# Patient Record
Sex: Female | Born: 2000 | Race: White | Hispanic: No | Marital: Single | Smoking: Never smoker
Health system: Southern US, Community
[De-identification: ages and names within clinical notes are randomized; demographics above are authoritative.]

## PROBLEM LIST (undated history)

## (undated) DIAGNOSIS — F419 Anxiety disorder, unspecified: Secondary | ICD-10-CM

## (undated) DIAGNOSIS — F32A Depression, unspecified: Secondary | ICD-10-CM

## (undated) DIAGNOSIS — F3181 Bipolar II disorder: Secondary | ICD-10-CM

## (undated) HISTORY — PX: MOUTH SURGERY: SHX715

## (undated) HISTORY — PX: FEMUR SURGERY: SHX943

---
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
## 2021-01-10 ENCOUNTER — Other Ambulatory Visit: Payer: Self-pay

## 2021-01-10 ENCOUNTER — Emergency Department
Admission: RE | Admit: 2021-01-10 | Discharge: 2021-01-10 | Disposition: A | Discharge status: Home / Self Care | Payer: BC Managed Care – PPO | Source: Ambulatory Visit

## 2021-01-10 VITALS — BP 120/79 | HR 92 | Temp 99.7°F | Resp 18 | Ht 62.0 in | Wt 115.0 lb

## 2021-01-10 DIAGNOSIS — R519 Headache, unspecified: Secondary | ICD-10-CM

## 2021-01-10 DIAGNOSIS — J029 Acute pharyngitis, unspecified: Secondary | ICD-10-CM | POA: Diagnosis not present

## 2021-01-10 HISTORY — DX: Depression, unspecified: F32.A

## 2021-01-10 HISTORY — DX: Bipolar II disorder: F31.81

## 2021-01-10 HISTORY — DX: Anxiety disorder, unspecified: F41.9

## 2021-01-10 LAB — POCT RAPID STREP A (OFFICE): Rapid Strep A Screen: NEGATIVE

## 2021-01-10 MED ORDER — PREDNISONE 20 MG PO TABS: ORAL_TABLET | ORAL | 0 refills | Status: DC

## 2021-01-10 MED ORDER — AZITHROMYCIN 250 MG PO TABS: 250.0000 mg | ORAL_TABLET | Freq: Every day | ORAL | 0 refills | Status: DC

## 2021-01-10 MED ORDER — KETOROLAC TROMETHAMINE 60 MG/2ML IM SOLN
60.0000 mg | Freq: Once | INTRAMUSCULAR | Status: AC
  Administered 2021-01-10: 60 mg via INTRAMUSCULAR

## 2021-01-10 NOTE — ED Triage Notes (Signed)
Pt presents to Urgent Care with c/o sore throat, headache, and chills since yesterday. Negative home COVID test done today. Pt has been vaccinated COVID, not flu.

## 2021-01-10 NOTE — Discharge Instructions (Signed)
Advised patient to take medications as directed with food to completion. Encouraged patient to increase daily water intake while taking these medications. 

## 2021-01-10 NOTE — ED Provider Notes (Signed)
[name]    CSN: [id number] Arrival date & time: 01/10/21  1614      History   Chief Complaint Chief Complaint  Patient presents with  . Sore Throat  . Headache  . Chills    HPI Jessica Cain is a 20 y.o. female.   HPI 20 year old female presents with sore throat, headache and chills for 1 day.  Reports negative home COVID-19 test.  Patient reports is vaccinated for COVID-19, not vaccinated for influenza this year.  Past Medical History:  Diagnosis Date  . Anxiety   . Bipolar 2 disorder (HCC)   . Depression     Patient Active Problem List   Diagnosis Date Noted  . Closed fracture of tuft of distal phalanx of right thumb 11/04/2017    Past Surgical History:  Procedure Laterality Date  . FEMUR SURGERY    . MOUTH SURGERY      OB History   No obstetric history on file.      Home Medications    Prior to Admission medications   Medication Sig Start Date End Date Taking? Authorizing Provider  azithromycin (ZITHROMAX) 250 MG tablet Take 1 tablet (250 mg total) by mouth daily. Take first 2 tablets together, then 1 every day until finished. 01/10/21  Yes Trevor Iha, FNP  buPROPion (ZYBAN) 150 MG 12 hr tablet Take 150 mg by mouth 2 (two) times daily.   Yes [provider]  Chlorphen-Phenyleph-Ibuprofen (ADVIL MULTI-SYMPTOM COLD & FLU PO) Take by mouth.   Yes [provider]  lamoTRIgine (LAMICTAL) 25 MG tablet Take 50 mg by mouth daily.   Yes [provider]  predniSONE (DELTASONE) 20 MG tablet Take 2 tabs PO daily x 5 days. 01/10/21  Yes Trevor Iha, FNP  sertraline (ZOLOFT) 50 MG tablet Take 50 mg by mouth daily.   Yes [provider]  Dupilumab, Asthma, (DUPIXENT [place]) Inject into the skin.    [provider]  Fexofenadine HCl (ALLEGRA PO) Take by mouth.    [provider]  hydrocortisone 2.5 % cream APPLY 1 APPLICATION TOPICALLY 2 (TWO) TIMES DAILY. 02/17/13   [provider]    Family  History Family History  Problem Relation Age of Onset  . Bipolar disorder Mother   . Healthy Father     Social History Social History   Tobacco Use  . Smoking status: Never Smoker  . Smokeless tobacco: Never Used  Vaping Use  . Vaping Use: Former  Substance Use Topics  . Alcohol use: Yes    Comment: occasionally  . Drug use: Not Currently     Allergies   Augmentin [amoxicillin-pot clavulanate], Cefzil [cefprozil], and Sulfa antibiotics   Review of Systems Review of Systems  Constitutional: Positive for fever.  HENT: Positive for sore throat.   Eyes: Negative.   Respiratory: Negative.   Cardiovascular: Negative.   Gastrointestinal: Negative.   Genitourinary: Negative.   Musculoskeletal: Negative.   Skin: Negative.   Neurological: Negative.      Physical Exam Triage Vital Signs ED Triage Vitals  Enc Vitals Group     BP 01/10/21 1700 120/79     Pulse Rate 01/10/21 1700 92     Resp 01/10/21 1700 18     Temp 01/10/21 1700 99.7 F (37.6 C)     Temp Source 01/10/21 1700 Oral     SpO2 01/10/21 1700 99 %     Weight 01/10/21 1653 115 lb (52.2 kg)     Height 01/10/21 1653 5'  2" (1.575 m)     Head Circumference --      Peak Flow --      Pain Score 01/10/21 1653 7     Pain Loc --      Pain Edu? --      Excl. in GC? --    No data found.  Updated Vital Signs BP 120/79 (BP Location: Right Arm)   Pulse 92   Temp 99.7 F (37.6 C) (Oral)   Resp 18   Ht 5\' 2"  (1.575 m)   Wt 115 lb (52.2 kg)   LMP 12/29/2020 (Exact Date)   SpO2 99%   BMI 21.03 kg/m      Physical Exam Vitals and nursing note reviewed.  Constitutional:      General: She is not in acute distress.    Appearance: She is well-developed and normal weight. She is not ill-appearing.  HENT:     Head: Normocephalic and atraumatic.     Right Ear: Tympanic membrane and ear canal normal.     Left Ear: Tympanic membrane and ear canal normal.     Mouth/Throat:     Mouth: Mucous membranes are moist.      Pharynx: Pharyngeal swelling, oropharyngeal exudate, posterior oropharyngeal erythema and uvula swelling present.     Tonsils: Tonsillar exudate present. 3+ on the right. 3+ on the left.  Eyes:     Conjunctiva/sclera: Conjunctivae normal.     Pupils: Pupils are equal, round, and reactive to light.  Cardiovascular:     Rate and Rhythm: Normal rate and regular rhythm.     Heart sounds: Normal heart sounds.  Pulmonary:     Effort: Pulmonary effort is normal.     Breath sounds: Rhonchi present. No wheezing or rales.  Musculoskeletal:     Cervical back: Normal range of motion and neck supple.  Lymphadenopathy:     Cervical: Cervical adenopathy present.  Skin:    General: Skin is warm and dry.  Neurological:     General: No focal deficit present.     Mental Status: She is alert and oriented to person, place, and time.  Psychiatric:        Mood and Affect: Mood normal.        Behavior: Behavior normal.      UC Treatments / Results  Labs (all labs ordered are listed, but only abnormal results are displayed) Labs Reviewed  POCT RAPID STREP A (OFFICE)    EKG   Radiology No results found.  Procedures Procedures (including critical care time)  Medications Ordered in UC Medications  ketorolac (TORADOL) injection 60 mg (60 mg Intramuscular Given 01/10/21 1753)    Initial Impression / Assessment and Plan / UC Course  I have reviewed the triage vital signs and the nursing notes.  Pertinent labs & imaging results that were available during my care of the patient were reviewed by me and considered in my medical decision making (see chart for details).     MDM: 1.  Acute pharyngitis, 2.  Intractable episodic headache, unspecified headache type.  Patient discharged home, hemodynamically stable. Final Clinical Impressions(s) / UC Diagnoses   Final diagnoses:  Pharyngitis, unspecified etiology  Intractable episodic headache, unspecified headache type     Discharge  Instructions     Advised patient to take medications as directed with food to completion.  Encouraged patient to increase daily water intake while taking these medications.    ED Prescriptions    Medication Sig Dispense Auth. Provider  azithromycin (ZITHROMAX) 250 MG tablet Take 1 tablet (250 mg total) by mouth daily. Take first 2 tablets together, then 1 every day until finished. 6 tablet Trevor Iha, FNP   predniSONE (DELTASONE) 20 MG tablet Take 2 tabs PO daily x 5 days. 10 tablet Trevor Iha, FNP     PDMP not reviewed this encounter.   Trevor Iha, FNP 01/10/21 Mallie Snooks

## 2022-05-18 ENCOUNTER — Ambulatory Visit: Payer: Self-pay

## 2022-05-18 NOTE — Telephone Encounter (Signed)
  Chief Complaint: Swollen lymph node Symptoms: ibid Frequency: years Pertinent Negatives: Patient denies fever, difficulty breathing, or swallowing. Disposition: [] ED /[] Urgent Care (no appt availability in office) / [] Appointment(In office/virtual)/ []  [facility]/ [] Home Care/ [] Refused Recommended Disposition /[] [facility]/ [x]  Follow-up with PCP Additional Notes: Pt has ENT appt Monday. PT will go to ED if needed.    Reason for Disposition  [1] Large node AND [2] present > 2 weeks  Answer Assessment - Initial Assessment Questions 1. LOCATION: "Where is the swollen node located?" "Is the matching node on the other side of the body also swollen?"      Neck - throat 2. SIZE: "How big is the node?" (e.g., inches or centimeters; or compared to common objects such as pea, bean, marble, golf ball)      Golf balls 3. ONSET: "When did the swelling start?"      years 4. NECK NODES: "Is there a sore throat, runny nose or other symptoms of a cold?"      This morning - always swollen a couple of years 5. GROIN OR ARMPIT NODES: "Is there a sore, scratch, cut or painful red area on that arm or leg?"      Throat 6. FEVER: "Do you have a fever?" If Yes, ask: "What is it, how was it measured, and when did it start?"      Night sweats 7. CAUSE: "What do you think is causing the swollen lymph nodes?"     Unsure -  8. OTHER SYMPTOMS: "Do you have any other symptoms?"     no 9. PREGNANCY: "Is there any chance you are pregnant?" "When was your last menstrual period?"     No  Protocols used: Lymph Nodes - Swollen-A-AH

## 2023-05-20 NOTE — Progress Notes (Unsigned)
New Patient Note  RE: Jessica Cain MRN: [id number] DOB: December 04, 2000 Date of Office Visit: 05/21/2023  Consult requested by: Arliss Journey, PA-C Primary care provider: Arliss Journey, PA-C  Chief Complaint: Allergic Rhinitis   History of Present Illness: I had the pleasure of seeing Jessica Cain for initial evaluation at the [facility] on 05/21/2023. She is a 22 y.o. female, who is referred here by Arliss Journey, PA-C for the evaluation of allergic rhinitis.  Discussed the use of AI scribe software for clinical note transcription with the patient, who gave verbal consent to proceed.  The patient, with a history of eczema, asthma, and allergies, presents with concerns of recurrent  neck lymphadenopathy over the past few months. She reports that her lymph nodes periodically swell and become tender to touch, but then resolve. These episodes are not associated with any systemic symptoms such as fever or malaise, nor with any upper respiratory symptoms such as stuffy nose, runny nose, or sneezing.  The patient also reports a history of environmental allergies, manifesting as stuffy nose and scratchy throat, which she manages with daily Allegra. She notes that her symptoms tend to worsen in the fall. She has not tried any nasal sprays or other allergy medications. She was on allergy shots about ten years ago for 1 year, which she believes helped her symptoms.  In addition to her lymphadenopathy and allergies, the patient reports a history of eczema since infancy, affecting her entire body. She manages her eczema with triamcinolone and frequent moisturizing. She has seen a dermatologist for this issue, and has tried Dupixent in the past which was ineffective.   The patient also has a history of asthma, for which she has an albuterol inhaler. However, she reports not needing to use it for several years. Despite this, she reports experiencing shortness of breath on a daily  basis for the past year, even at rest. She has not required prednisone or hospitalization for her asthma.  The patient has multiple allergies, including to all nuts, pet dander, grass, dust, penicillin, Augmentin, cefazolin, and sulfa. Her reactions typically manifest as rashes, and in the case of nut allergies, throat swelling. She carries an EpiPen but has not needed to use it. She also reports a reaction to bee stings, which causes a localized rash.  The patient has a history of a blood clotting disorder and has seen an infectious disease specialist for her lymphadenopathy. She has also had a biopsy of one of her lymph nodes, which was reportedly normal. She has not had any recent fevers, chills, or changes in appetite or bowel habits. She reports occasional heartburn, but does not consider it severe. She does not have any pets at home.   Last eye exam: 1-2 years ago. History of reflux: sometimes.  Assessment and Plan: Jessica Cain is a 22 y.o. female with: Other allergic rhinitis Year-round symptoms managed with daily Allegra, worse in the fall. History of allergy shots in middle school with some improvement. Concerned if cervical lymphadenopathy due to this.  Return for environmental allergy skin testing. Will make additional recommendations based on results.  Moderate persistent asthma without complication Daily shortness of breath for the past year. No recent use of albuterol inhaler. Regular exercise with noted difficulty catching breath post-cardio. Today's spirometry showed: normal pattern with 5% improvement in FEV1 post bronchodilator treatment. Clinically feeling improved.  Daily controller medication(s): start Breo 1 puff once a day and rinse mouth after each use. Demonstrated proper use.  Prior to physical activity: May use albuterol rescue inhaler 2 puffs 5 to 15 minutes prior to strenuous physical activities. Rescue medications: May use albuterol rescue inhaler 2 puffs every 4  to 6 hours as needed for shortness of breath, chest tightness, coughing, and wheezing. Monitor frequency of use.   Lymphadenopathy, cervical Intermittent swelling of lymph nodes over the past few years, without associated illness or symptoms. Previous biopsy showed no abnormalities. Monitor symptoms.  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 Return for food allergy skin testing. Continue to avoid peanuts and tree nuts. For mild symptoms you can take over the counter antihistamines such as Benadryl 1-2 tablets = 25-50mg  and monitor symptoms closely. If symptoms worsen or if you have severe symptoms including breathing issues, throat closure, significant swelling, whole body hives, severe diarrhea and vomiting, lightheadedness then inject epinephrine and seek immediate medical care afterwards. Emergency action plan given.   Other atopic dermatitis Longstanding condition since childhood, currently managed with topical steroid creams. Recent trial of Dupixent without noticeable improvement.  See below for proper skin care. Don't wear any perfume. Use fragrance free and dye free products. No dryer sheets or fabric softener.    Return in about 2 weeks (around 06/04/2023) for Skin testing.  Meds ordered this encounter  Medications   fluticasone furoate-vilanterol (BREO ELLIPTA) 100-25 MCG/ACT AEPB    Sig: Inhale 1 puff into the lungs daily. Rinse mouth after each use.    Dispense:  60 each    Refill:  2   Lab Orders  No laboratory test(s) ordered today    Other allergy screening: Asthma: yes Food allergy: yes Dietary History: patient has been eating other foods including milk, eggs, sesame, shellfish, fish, soy, wheat, meats, fruits and vegetables.  She reports reading labels and avoiding peanuts, tree nuts in diet completely.  Medication allergy: yes Hymenoptera allergy: no Urticaria:  no Eczema:yes History of recurrent infections suggestive of immunodeficency: no  Diagnostics: Spirometry:  Tracings reviewed. Her effort: Good reproducible efforts. FVC: 3.78L FEV1: 3.14L, 102% predicted FEV1/FVC ratio: 83% Interpretation: Spirometry consistent with normal pattern with 5% improvement in FEV1 post bronchodilator treatment. Clinically feeling improved.   Please see scanned spirometry results for details.  Past Medical History: Patient Active Problem List   Diagnosis Date Noted   Closed fracture of tuft of distal phalanx of right thumb 11/04/2017   Past Medical History:  Diagnosis Date   Anxiety    Bipolar 2 disorder (HCC)    Depression    Past Surgical History: Past Surgical History:  Procedure Laterality Date   FEMUR SURGERY     MOUTH SURGERY     Medication List:  Current Outpatient Medications  Medication Sig Dispense Refill   Ascorbic Acid (VITAMIN C CR) 500 MG CPCR Take by mouth.     buPROPion (WELLBUTRIN XL) 150 MG 24 hr tablet Take 150 mg by mouth daily.     Fexofenadine HCl (ALLEGRA PO) Take by mouth.     fluticasone furoate-vilanterol (BREO ELLIPTA) 100-25 MCG/ACT AEPB Inhale 1 puff into the lungs daily. Rinse mouth after each use. 60 each 2   hydrocortisone 2.5 % cream APPLY 1 APPLICATION TOPICALLY 2 (TWO) TIMES DAILY.     hydrOXYzine (ATARAX) 25 MG tablet Take 25 mg by mouth every 8 (eight) hours as needed.     triamcinolone cream (KENALOG) 0.1 % Apply 1 Application topically 2 (two) times daily.     No current facility-administered  medications for this visit.   Allergies: Allergies  Allergen Reactions   Augmentin [Amoxicillin-Pot Clavulanate]    Cefzil [Cefprozil]    Sulfa Antibiotics    Social History: Social History   Socioeconomic History   Marital status: Single    Spouse name: Not on file   Number of children: Not on file   Years of education: Not on file   Highest education level: Not on file  Occupational History   Not on  file  Tobacco Use   Smoking status: Never    Passive exposure: Never   Smokeless tobacco: Never  Vaping Use   Vaping status: Some Days  Substance and Sexual Activity   Alcohol use: Yes    Comment: occasionally   Drug use: Not Currently   Sexual activity: Not on file  Other Topics Concern   Not on file  Social History Narrative   Not on file   Social Determinants of Health   Financial Resource Strain: Low Risk  (09/11/2022)   Received from [facility]   Overall Financial Resource Strain (CARDIA)    Difficulty of Paying Living Expenses: Not hard at all  Food Insecurity: No Food Insecurity (09/11/2022)   Received from [facility]   Hunger Vital Sign    Worried About Running Out of Food in the Last Year: Never true    Ran Out of Food in the Last Year: Never true  Transportation Needs: No Transportation Needs (09/11/2022)   Received from [facility] - Transportation    Lack of Transportation (Medical): No    Lack of Transportation (Non-Medical): No  Physical Activity: Sufficiently Active (05/09/2022)   Received from [facility]   Exercise Vital Sign    Days of Exercise per Week: 6 days    Minutes of Exercise per Session: 90 min  Stress: No Stress Concern Present (05/09/2022)   Received from [facility] - Occupational Stress Questionnaire    Feeling of Stress : Only a little  Social Connections: Unknown (05/16/2023)   Received from [facility]   Social Network    Social Network: Not on file   Lives in a house. Smoking: vapes sometimes Occupation: Architectural technologist HistorySurveyor, minerals in the house: no Engineer, civil (consulting) in the family room: no Carpet in the bedroom: yes Heating: electric Cooling: central Pet: no  Family History: Family History  Problem Relation Age of Onset   Bipolar disorder Mother    Healthy Father    Allergic rhinitis Neg Hx    Angioedema Neg Hx     Asthma Neg Hx    Atopy Neg Hx    Eczema Neg Hx    Immunodeficiency Neg Hx    Urticaria Neg Hx    Review of Systems  Constitutional:  Negative for appetite change, chills, fever and unexpected weight change.  HENT:  Negative for congestion and rhinorrhea.   Eyes:  Negative for itching.  Respiratory:  Positive for shortness of breath. Negative for cough, chest tightness and wheezing.   Cardiovascular:  Negative for chest pain.  Gastrointestinal:  Negative for abdominal pain.  Genitourinary:  Negative for difficulty urinating.  Skin:  Positive for rash.  Neurological:  Negative for headaches.    Objective: BP 100/70   Pulse 74   Temp 98.6 F (37 C) (Temporal)   Resp 18   Ht 5' 2.5" (1.588 m)   Wt 110 lb 12.8 oz (50.3 kg)  SpO2 98%   BMI 19.94 kg/m  Body mass index is 19.94 kg/m. Physical Exam Vitals and nursing note reviewed.  Constitutional:      Appearance: Normal appearance. She is well-developed.  HENT:     Head: Normocephalic and atraumatic.     Right Ear: Tympanic membrane and external ear normal.     Left Ear: Tympanic membrane and external ear normal.     Nose: Nose normal.     Mouth/Throat:     Mouth: Mucous membranes are moist.     Pharynx: Oropharynx is clear.  Eyes:     Conjunctiva/sclera: Conjunctivae normal.  Cardiovascular:     Rate and Rhythm: Normal rate and regular rhythm.     Heart sounds: Normal heart sounds. No murmur heard.    No friction rub. No gallop.  Pulmonary:     Effort: Pulmonary effort is normal.     Breath sounds: Normal breath sounds. No wheezing, rhonchi or rales.  Musculoskeletal:     Cervical back: Neck supple.  Skin:    General: Skin is warm.     Findings: No rash.     Comments: Cracked, dry skin on the corners of the lips.   Neurological:     Mental Status: She is alert and oriented to person, place, and time.  Psychiatric:        Behavior: Behavior normal.   The plan was reviewed with the patient/family, and all  questions/concerned were addressed.  It was my pleasure to see [facility] today and participate in her care. Please feel free to contact me with any questions or concerns.  Sincerely,  Wyline Mood, DO Allergy & Immunology  [facility] office: [phone] [facility] office: [phone]

## 2023-05-21 ENCOUNTER — Ambulatory Visit: Payer: BC Managed Care – PPO | Admitting: Allergy

## 2023-05-21 ENCOUNTER — Encounter: Payer: Self-pay | Admitting: Allergy

## 2023-05-21 VITALS — BP 100/70 | HR 74 | Temp 98.6°F | Resp 18 | Ht 62.5 in | Wt 110.8 lb

## 2023-05-21 DIAGNOSIS — J4540 Moderate persistent asthma, uncomplicated: Secondary | ICD-10-CM

## 2023-05-21 DIAGNOSIS — J3089 Other allergic rhinitis: Secondary | ICD-10-CM | POA: Diagnosis not present

## 2023-05-21 DIAGNOSIS — R590 Localized enlarged lymph nodes: Secondary | ICD-10-CM

## 2023-05-21 DIAGNOSIS — L2089 Other atopic dermatitis: Secondary | ICD-10-CM

## 2023-05-21 DIAGNOSIS — T7800XD Anaphylactic reaction due to unspecified food, subsequent encounter: Secondary | ICD-10-CM | POA: Diagnosis not present

## 2023-05-21 MED ORDER — FLUTICASONE FUROATE-VILANTEROL 100-25 MCG/ACT IN AEPB: 1.0000 | INHALATION_SPRAY | Freq: Every day | RESPIRATORY_TRACT | 2 refills | Status: DC

## 2023-05-21 NOTE — Patient Instructions (Signed)
Rhinitis  Return for environmental allergy skin testing. Will make additional recommendations based on results. Make sure you don't take any antihistamines for 3 days before the skin testing appointment. Don't put any lotion on the back. Plan on being here for 30-60 minutes.   Lymph nodes Monitor symptoms.  Breathing Daily controller medication(s): start Breo 1 puff once a day and rinse mouth after each use.  Check the pricing for the following inhalers if Virgel Bouquet is not covered. Dulera Advair HFA Advair Diskus Wixela Symbicort  Prior to physical activity: May use albuterol rescue inhaler 2 puffs 5 to 15 minutes prior to strenuous physical activities. Rescue medications: May use albuterol rescue inhaler 2 puffs every 4 to 6 hours as needed for shortness of breath, chest tightness, coughing, and wheezing. Monitor frequency of use.  Breathing control goals:  Full participation in all desired activities (may need albuterol before activity) Albuterol use two times or less a week on average (not counting use with activity) Cough interfering with sleep two times or less a month Oral steroids no more than once a year No hospitalizations   Food allergies Return for food allergy skin testing. Continue to avoid peanuts and tree nuts. For mild symptoms you can take over the counter antihistamines such as Benadryl 1-2 tablets = 25-50mg  and monitor symptoms closely. If symptoms worsen or if you have severe symptoms including breathing issues, throat closure, significant swelling, whole body hives, severe diarrhea and vomiting, lightheadedness then inject epinephrine and seek immediate medical care afterwards. Emergency action plan given.   Eczema  See below for proper skin care. Don't wear any perfume. Use fragrance free and dye free products. No dryer sheets or fabric softener.    Return for Skin testing. On 10/29 at [facility]  Skin care recommendations  Bath time: Always use  lukewarm water. AVOID very hot or cold water. Keep bathing time to 5-10 minutes. Do NOT use bubble bath. Use a mild soap and use just enough to wash the dirty areas. Do NOT scrub skin vigorously.  After bathing, pat dry your skin with a towel. Do NOT rub or scrub the skin.  Moisturizers and prescriptions:  ALWAYS apply moisturizers immediately after bathing (within 3 minutes). This helps to lock-in moisture. Use the moisturizer several times a day over the whole body. Good summer moisturizers include: Aveeno, CeraVe, Cetaphil. Good winter moisturizers include: Aquaphor, Vaseline, Cerave, Cetap0hil, Eucerin, Vanicream. When using moisturizers along with medications, the moisturizer should be applied about one hour after applying the medication to prevent diluting effect of the medication or moisturize around where you applied the medications. When not using medications, the moisturizer can be continued twice daily as maintenance.  Laundry and clothing: Avoid laundry products with added color or perfumes. Use unscented hypo-allergenic laundry products such as Tide free, Cheer free & gentle, and All free and clear.  If the skin still seems dry or sensitive, you can try double-rinsing the clothes. Avoid tight or scratchy clothing such as wool. Do not use fabric softeners or dyer sheets.

## 2023-06-03 NOTE — Progress Notes (Deleted)
Skin testing note  RE: Jessica Cain MRN: [id number] DOB: 2001/01/03 Date of Office Visit: 06/04/2023  Referring provider: Arliss Journey, PA-C Primary care provider: Arliss Journey, PA-C  Chief Complaint: No chief complaint on file.  History of Present Illness: I had the pleasure of seeing Jessica Cain for a skin testing visit at the [facility] on 06/03/2023. She is a 22 y.o. female, who is being followed for allergic rhinitis, asthma, food allergy, atopic dermatitis, cervical lymphadenopathy. Her previous allergy office visit was on 05/21/2023 with Dr. Selena Batten. Today is a skin testing visit.   Discussed the use of AI scribe software for clinical note transcription with the patient, who gave verbal consent to proceed.  History of Present Illness             Other allergic rhinitis Year-round symptoms managed with daily Allegra, worse in the fall. History of allergy shots in middle school with some improvement. Concerned if cervical lymphadenopathy due to this.  Return for environmental allergy skin testing. Will make additional recommendations based on results.   Moderate persistent asthma without complication Daily shortness of breath for the past year. No recent use of albuterol inhaler. Regular exercise with noted difficulty catching breath post-cardio. Today's spirometry showed: normal pattern with 5% improvement in FEV1 post bronchodilator treatment. Clinically feeling improved.  Daily controller medication(s): start Breo 1 puff once a day and rinse mouth after each use. Demonstrated proper use.  Prior to physical activity: May use albuterol rescue inhaler 2 puffs 5 to 15 minutes prior to strenuous physical activities. Rescue medications: May use albuterol rescue inhaler 2 puffs every 4 to 6 hours as needed for shortness of breath, chest tightness, coughing, and wheezing. Monitor frequency of use.    Lymphadenopathy, cervical Intermittent swelling  of lymph nodes over the past few years, without associated illness or symptoms. Previous biopsy showed no abnormalities. Monitor symptoms.   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 Return for food allergy skin testing. Continue to avoid peanuts and tree nuts. For mild symptoms you can take over the counter antihistamines such as Benadryl 1-2 tablets = 25-50mg  and monitor symptoms closely. If symptoms worsen or if you have severe symptoms including breathing issues, throat closure, significant swelling, whole body hives, severe diarrhea and vomiting, lightheadedness then inject epinephrine and seek immediate medical care afterwards. Emergency action plan given.    Other atopic dermatitis Longstanding condition since childhood, currently managed with topical steroid creams. Recent trial of Dupixent without noticeable improvement.  See below for proper skin care. Don't wear any perfume. Use fragrance free and dye free products. No dryer sheets or fabric softener.     Return in about 2 weeks (around 06/04/2023) for Skin testing. Assessment and Plan: [name] is a 22 y.o. female with: ***  Assessment and Plan              No follow-ups on file.  No orders of the defined types were placed in this encounter.  Lab Orders  No laboratory test(s) ordered today    Diagnostics: Spirometry:  Tracings reviewed. Her effort: {Blank single:19197::"Good reproducible efforts.","It was hard to get consistent efforts and there is a question as to whether this reflects a maximal maneuver.","Poor effort, data can not be interpreted."} FVC: ***L FEV1: ***L, ***% predicted FEV1/FVC ratio: ***% Interpretation: {Blank single:19197::"Spirometry consistent with mild obstructive disease","Spirometry consistent with moderate obstructive disease","Spirometry consistent with severe obstructive disease","Spirometry consistent  with possible restrictive disease","Spirometry consistent with mixed obstructive and restrictive disease","Spirometry uninterpretable due to technique","Spirometry consistent with normal pattern","No overt abnormalities noted given today's efforts"}.  Please see scanned spirometry results for details.  Skin Testing: Environmental allergy panel and select foods. *** Results discussed with patient/family.   Previous notes and tests were reviewed. The plan was reviewed with the patient/family, and all questions/concerned were addressed.  It was my pleasure to see [facility] today and participate in her care. Please feel free to contact me with any questions or concerns.  Sincerely,  Wyline Mood, DO Allergy & Immunology  [facility] office: [phone] [facility] office: [phone]

## 2023-06-04 ENCOUNTER — Ambulatory Visit: Payer: BC Managed Care – PPO | Admitting: Allergy

## 2023-06-13 ENCOUNTER — Ambulatory Visit: Payer: BC Managed Care – PPO | Admitting: Allergy

## 2023-06-13 NOTE — Progress Notes (Deleted)
Skin testing note  RE: Jessica [facility] MRN: [id number] DOB: [birth year] Date of Office Visit: 06/13/2023  Referring provider: Arliss Journey, PA-C Primary care provider: Arliss Journey, PA-C  Chief Complaint: No chief complaint on file.  History of Present Illness: I had the pleasure of seeing Jessica [facility] for a skin testing visit at the [facility] on 06/13/2023. She is a 22 y.o. female, who is being followed for allergic rhinitis, asthma, food allergy, atopic dermatitis. Her previous allergy office visit was on 05/21/2023 with Dr. Selena [facility]. Today is a skin testing visit.   Discussed the use of AI scribe software for clinical note transcription with the patient, who gave verbal consent to proceed.  History of Present Illness             Other allergic rhinitis Year-round symptoms managed with daily Allegra, worse in the fall. History of allergy shots in middle school with some improvement. Concerned if cervical lymphadenopathy due to this.  Return for environmental allergy skin testing. Will make additional recommendations based on results.   Moderate persistent asthma without complication Daily shortness of breath for the past year. No recent use of albuterol inhaler. Regular exercise with noted difficulty catching breath post-cardio. Today's spirometry showed: normal pattern with 5% improvement in FEV1 post bronchodilator treatment. Clinically feeling improved.  Daily controller medication(s): start Breo 1 puff once a day and rinse mouth after each use. Demonstrated proper use.  Prior to physical activity: May use albuterol rescue inhaler 2 puffs 5 to 15 minutes prior to strenuous physical activities. Rescue medications: May use albuterol rescue inhaler 2 puffs every 4 to 6 hours as needed for shortness of breath, chest tightness, coughing, and wheezing. Monitor frequency of use.    Lymphadenopathy, cervical Intermittent swelling of lymph nodes over the past  few years, without associated illness or symptoms. Previous biopsy showed no abnormalities. Monitor symptoms.   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 Return for food allergy skin testing. Continue to avoid peanuts and tree nuts. For mild symptoms you can take over the counter antihistamines such as Benadryl 1-2 tablets = 25-50mg  and monitor symptoms closely. If symptoms worsen or if you have severe symptoms including breathing issues, throat closure, significant swelling, whole body hives, severe diarrhea and vomiting, lightheadedness then inject epinephrine and seek immediate medical care afterwards. Emergency action plan given.    Other atopic dermatitis Longstanding condition since childhood, currently managed with topical steroid creams. Recent trial of Dupixent without noticeable improvement.  See below for proper skin care. Don't wear any perfume. Use fragrance free and dye free products. No dryer sheets or fabric softener.     Return in about 2 weeks (around 06/04/2023) for Skin testing. Assessment and Plan: [name] is a 22 y.o. female with: ***  Assessment and Plan              No follow-ups on file.  No orders of the defined types were placed in this encounter.  Lab Orders  No laboratory test(s) ordered today    Diagnostics: Spirometry:  Tracings reviewed. Her effort: {Blank single:19197::"Good reproducible efforts.","It was hard to get consistent efforts and there is a question as to whether this reflects a maximal maneuver.","Poor effort, data can not be interpreted."} FVC: ***L FEV1: ***L, ***% predicted FEV1/FVC ratio: ***% Interpretation: {Blank single:19197::"Spirometry consistent with mild obstructive disease","Spirometry consistent with moderate obstructive disease","Spirometry consistent with severe obstructive disease","Spirometry consistent with possible  restrictive  disease","Spirometry consistent with mixed obstructive and restrictive disease","Spirometry uninterpretable due to technique","Spirometry consistent with normal pattern","No overt abnormalities noted given today's efforts"}.  Please see scanned spirometry results for details.  Skin Testing: {Blank single:19197::"Select foods","Environmental allergy panel","Environmental allergy panel and select foods","Food allergy panel","None","Deferred due to recent antihistamines use"}. *** Results discussed with patient/family.   Previous notes and tests were reviewed. The plan was reviewed with the patient/family, and all questions/concerned were addressed.  It was my pleasure to see [name] today and participate in her care. Please feel free to contact me with any questions or concerns.  Sincerely,  Wyline Mood, DO Allergy & Immunology  [facility] office: [phone] [facility] office: [phone]

## 2023-06-26 NOTE — Progress Notes (Unsigned)
Skin testing note  RE: Jessica Cain MRN: [id number] DOB: November 25, 2000 Date of Office Visit: 06/27/2023  Referring provider: Arliss Journey, PA-C Primary care provider: Arliss Journey, PA-C  Chief Complaint: No chief complaint on file.  History of Present Illness: I had the pleasure of seeing Jessica [facility] for a skin testing visit at the [facility] on 06/26/2023. She is a 22 y.o. female, who is being followed for allergic rhinitis, asthma, food allergy, AD. Her previous allergy office visit was on 05/21/2023 with Dr. Selena [facility]. Today is a skin testing visit.   Discussed the use of AI scribe software for clinical note transcription with the patient, who gave verbal consent to proceed.  History of Present Illness             Other allergic rhinitis Year-round symptoms managed with daily Allegra, worse in the fall. History of allergy shots in middle school with some improvement. Concerned if cervical lymphadenopathy due to this.  Return for environmental allergy skin testing. Will make additional recommendations based on results.   Moderate persistent asthma without complication Daily shortness of breath for the past year. No recent use of albuterol inhaler. Regular exercise with noted difficulty catching breath post-cardio. Today's spirometry showed: normal pattern with 5% improvement in FEV1 post bronchodilator treatment. Clinically feeling improved.  Daily controller medication(s): start Breo 1 puff once a day and rinse mouth after each use. Demonstrated proper use.  Prior to physical activity: May use albuterol rescue inhaler 2 puffs 5 to 15 minutes prior to strenuous physical activities. Rescue medications: May use albuterol rescue inhaler 2 puffs every 4 to 6 hours as needed for shortness of breath, chest tightness, coughing, and wheezing. Monitor frequency of use.    Lymphadenopathy, cervical Intermittent swelling of lymph nodes over the past few years,  without associated illness or symptoms. Previous biopsy showed no abnormalities. Monitor symptoms.   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 Return for food allergy skin testing. Continue to avoid peanuts and tree nuts. For mild symptoms you can take over the counter antihistamines such as Benadryl 1-2 tablets = 25-50mg  and monitor symptoms closely. If symptoms worsen or if you have severe symptoms including breathing issues, throat closure, significant swelling, whole body hives, severe diarrhea and vomiting, lightheadedness then inject epinephrine and seek immediate medical care afterwards. Emergency action plan given.    Other atopic dermatitis Longstanding condition since childhood, currently managed with topical steroid creams. Recent trial of Dupixent without noticeable improvement.  See below for proper skin care. Don't wear any perfume. Use fragrance free and dye free products. No dryer sheets or fabric softener.   Assessment and Plan: Jessica Cain is a 22 y.o. female with: ***  Assessment and Plan              No follow-ups on file.  No orders of the defined types were placed in this encounter.  Lab Orders  No laboratory test(s) ordered today    Diagnostics: Spirometry:  Tracings reviewed. Her effort: {Blank single:19197::"Good reproducible efforts.","It was hard to get consistent efforts and there is a question as to whether this reflects a maximal maneuver.","Poor effort, data can not be interpreted."} FVC: ***L FEV1: ***L, ***% predicted FEV1/FVC ratio: ***% Interpretation: {Blank single:19197::"Spirometry consistent with mild obstructive disease","Spirometry consistent with moderate obstructive disease","Spirometry consistent with severe obstructive disease","Spirometry consistent with possible restrictive disease","Spirometry consistent with mixed obstructive and restrictive  disease","Spirometry uninterpretable due to  technique","Spirometry consistent with normal pattern","No overt abnormalities noted given today's efforts"}.  Please see scanned spirometry results for details.  Skin Testing: Environmental allergy panel and select foods. *** Results discussed with patient/family.   Previous notes and tests were reviewed. The plan was reviewed with the patient/family, and all questions/concerned were addressed.  It was my pleasure to see Jessica Cain today and participate in her care. Please feel free to contact me with any questions or concerns.  Sincerely,  Wyline Mood, DO Allergy & Immunology  [name] office: [phone] [facility] office: [phone]

## 2023-06-27 ENCOUNTER — Encounter: Payer: Self-pay | Admitting: Allergy

## 2023-06-27 ENCOUNTER — Ambulatory Visit: Payer: BC Managed Care – PPO | Admitting: Allergy

## 2023-06-27 DIAGNOSIS — T7800XD Anaphylactic reaction due to unspecified food, subsequent encounter: Secondary | ICD-10-CM

## 2023-06-27 DIAGNOSIS — J4540 Moderate persistent asthma, uncomplicated: Secondary | ICD-10-CM

## 2023-06-27 DIAGNOSIS — R590 Localized enlarged lymph nodes: Secondary | ICD-10-CM

## 2023-06-27 DIAGNOSIS — J3089 Other allergic rhinitis: Secondary | ICD-10-CM

## 2023-06-27 DIAGNOSIS — J301 Allergic rhinitis due to pollen: Secondary | ICD-10-CM

## 2023-06-27 DIAGNOSIS — L2089 Other atopic dermatitis: Secondary | ICD-10-CM

## 2023-06-27 DIAGNOSIS — J3081 Allergic rhinitis due to animal (cat) (dog) hair and dander: Secondary | ICD-10-CM

## 2023-06-27 MED ORDER — FLUTICASONE PROPIONATE 50 MCG/ACT NA SUSP: 1.0000 | Freq: Every day | NASAL | 5 refills | Status: DC | PRN

## 2023-06-27 NOTE — Patient Instructions (Addendum)
Today's skin testing positive to grass, weed, ragweed, trees, mold, dust mites, cat, dog, horse.  Positive to peanuts, walnuts, hazelnuts.  Results given.  Environmental allergies Start environmental control measures as below. Use over the counter antihistamines such as Zyrtec (cetirizine), Claritin (loratadine), Allegra (fexofenadine), or Xyzal (levocetirizine) daily as needed. May take twice a day during allergy flares. May switch antihistamines every few months. Use Flonase (fluticasone) nasal spray 1-2 sprays per nostril once a day as needed for nasal congestion.  Nasal saline spray (i.e., Simply Saline) or nasal saline lavage (i.e., NeilMed) is recommended as needed and prior to medicated nasal sprays. Consider allergy injections for long term control if above medications do not help the symptoms - handout given.  Lymph nodes Monitor symptoms.  Breathing Daily controller medication(s): continue Breo 1 puff once a day and rinse mouth after each use. Prior to physical activity: May use albuterol rescue inhaler 2 puffs 5 to 15 minutes prior to strenuous physical activities. Rescue medications: May use albuterol rescue inhaler 2 puffs every 4 to 6 hours as needed for shortness of breath, chest tightness, coughing, and wheezing. Monitor frequency of use.  Breathing control goals:  Full participation in all desired activities (may need albuterol before activity) Albuterol use two times or less a week on average (not counting use with activity) Cough interfering with sleep two times or less a month Oral steroids no more than once a year No hospitalizations   Food allergies Continue to avoid peanuts and tree nuts. For mild symptoms you can take over the counter antihistamines such as Benadryl 1-2 tablets = 25-50mg  and monitor symptoms closely. If symptoms worsen or if you have severe symptoms including breathing issues, throat closure, significant swelling, whole body hives, severe  diarrhea and vomiting, lightheadedness then inject epinephrine and seek immediate medical care afterwards. Emergency action plan in place.  Eczema  Continue proper skin care. Don't wear any perfume. Use fragrance free and dye free products. No dryer sheets or fabric softener.    Follow up in 3 months or sooner if needed.  Reducing Pollen Exposure Pollen seasons: trees (spring), grass (summer) and ragweed/weeds (fall). Keep windows closed in your home and car to lower pollen exposure.  Install air conditioning in the bedroom and throughout the house if possible.  Avoid going out in dry windy days - especially early morning. Pollen counts are highest between 5 - 10 AM and on dry, hot and windy days.  Save outside activities for late afternoon or after a heavy rain, when pollen levels are lower.  Avoid mowing of grass if you have grass pollen allergy. Be aware that pollen can also be transported indoors on people and pets.  Dry your clothes in an automatic dryer rather than hanging them outside where they might collect pollen.  Rinse hair and eyes before bedtime. Mold Control Mold and fungi can grow on a variety of surfaces provided certain temperature and moisture conditions exist.  Outdoor molds grow on plants, decaying vegetation and soil. The major outdoor mold, Alternaria and Cladosporium, are found in very high numbers during hot and dry conditions. Generally, a late summer - fall peak is seen for common outdoor fungal spores. Rain will temporarily lower outdoor mold spore count, but counts rise rapidly when the rainy period ends. The most important indoor molds are Aspergillus and Penicillium. Dark, humid and poorly ventilated basements are ideal sites for mold growth. The next most common sites of mold growth are the bathroom and the kitchen.  Outdoor (Seasonal) Mold Control Use air conditioning and keep windows closed. Avoid exposure to decaying vegetation. Avoid leaf raking. Avoid  grain handling. Consider wearing a face mask if working in moldy areas.  Indoor (Perennial) Mold Control  Maintain humidity below 50%. Get rid of mold growth on hard surfaces with water, detergent and, if necessary, 5% bleach (do not mix with other cleaners). Then dry the area completely. If mold covers an area more than 10 square feet, consider hiring an indoor environmental professional. For clothing, washing with soap and water is best. If moldy items cannot be cleaned and dried, throw them away. Remove sources e.g. contaminated carpets. Repair and seal leaking roofs or pipes. Using dehumidifiers in damp basements may be helpful, but empty the water and clean units regularly to prevent mildew from forming. All rooms, especially basements, bathrooms and kitchens, require ventilation and cleaning to deter mold and mildew growth. Avoid carpeting on concrete or damp floors, and storing items in damp areas. Control of House Dust Mite Allergen Dust mite allergens are a common trigger of allergy and asthma symptoms. While they can be found throughout the house, these microscopic creatures thrive in warm, humid environments such as bedding, upholstered furniture and carpeting. Because so much time is spent in the bedroom, it is essential to reduce mite levels there.  Encase pillows, mattresses, and box springs in special allergen-proof fabric covers or airtight, zippered plastic covers.  Bedding should be washed weekly in hot water (130 F) and dried in a hot dryer. Allergen-proof covers are available for comforters and pillows that can't be regularly washed.  Wash the allergy-proof covers every few months. Minimize clutter in the bedroom. Keep pets out of the bedroom.  Keep humidity less than 50% by using a dehumidifier or air conditioning. You can buy a humidity measuring device called a hygrometer to monitor this.  If possible, replace carpets with hardwood, linoleum, or washable area rugs. If that's  not possible, vacuum frequently with a vacuum that has a HEPA filter. Remove all upholstered furniture and non-washable window drapes from the bedroom. Remove all non-washable stuffed toys from the bedroom.  Wash stuffed toys weekly. Pet Allergen Avoidance: Contrary to popular opinion, there are no "hypoallergenic" breeds of dogs or cats. That is because people are not allergic to an animal's hair, but to an allergen found in the animal's saliva, dander (dead skin flakes) or urine. Pet allergy symptoms typically occur within minutes. For some people, symptoms can build up and become most severe 8 to 12 hours after contact with the animal. People with severe allergies can experience reactions in public places if dander has been transported on the pet owners' clothing. Keeping an animal outdoors is only a partial solution, since homes with pets in the yard still have higher concentrations of animal allergens. Before getting a pet, ask your allergist to determine if you are allergic to animals. If your pet is already considered part of your family, try to minimize contact and keep the pet out of the bedroom and other rooms where you spend a great deal of time. As with dust mites, vacuum carpets often or replace carpet with a hardwood floor, tile or linoleum. High-efficiency particulate air (HEPA) cleaners can reduce allergen levels over time. While dander and saliva are the source of cat and dog allergens, urine is the source of allergens from rabbits, hamsters, mice and [place] pigs; so ask a non-allergic family member to clean the animal's cage. If you have a pet allergy,  talk to your allergist about the potential for allergy immunotherapy (allergy shots). This strategy can often provide long-term relief. Cockroach Allergen Avoidance Cockroaches are often found in the homes of densely populated urban areas, schools or commercial buildings, but these creatures can lurk almost anywhere. This does not mean  that you have a dirty house or living area. Block all areas where roaches can enter the home. This includes crevices, wall cracks and windows.  Cockroaches need water to survive, so fix and seal all leaky faucets and pipes. Have an exterminator go through the house when your family and pets are gone to eliminate any remaining roaches. Keep food in lidded containers and put pet food dishes away after your pets are done eating. Vacuum and sweep the floor after meals, and take out garbage and recyclables. Use lidded garbage containers in the kitchen. Wash dishes immediately after use and clean under stoves, refrigerators or toasters where crumbs can accumulate. Wipe off the stove and other kitchen surfaces and cupboards regularly.  Skin care recommendations  Bath time: Always use lukewarm water. AVOID very hot or cold water. Keep bathing time to 5-10 minutes. Do NOT use bubble bath. Use a mild soap and use just enough to wash the dirty areas. Do NOT scrub skin vigorously.  After bathing, pat dry your skin with a towel. Do NOT rub or scrub the skin.  Moisturizers and prescriptions:  ALWAYS apply moisturizers immediately after bathing (within 3 minutes). This helps to lock-in moisture. Use the moisturizer several times a day over the whole body. Good summer moisturizers include: Aveeno, CeraVe, Cetaphil. Good winter moisturizers include: Aquaphor, Vaseline, Cerave, Cetap0hil, Eucerin, Vanicream. When using moisturizers along with medications, the moisturizer should be applied about one hour after applying the medication to prevent diluting effect of the medication or moisturize around where you applied the medications. When not using medications, the moisturizer can be continued twice daily as maintenance.  Laundry and clothing: Avoid laundry products with added color or perfumes. Use unscented hypo-allergenic laundry products such as Tide free, Cheer free & gentle, and All free and clear.  If  the skin still seems dry or sensitive, you can try double-rinsing the clothes. Avoid tight or scratchy clothing such as wool. Do not use fabric softeners or dyer sheets.    No follow-ups on file. Or sooner if needed.

## 2023-09-26 ENCOUNTER — Encounter: Payer: Self-pay | Admitting: Allergy

## 2023-09-26 ENCOUNTER — Ambulatory Visit: Payer: 59 | Admitting: Allergy

## 2023-09-26 NOTE — Telephone Encounter (Signed)
Called and spoke with patient and rescheduled her appt.

## 2023-09-30 NOTE — Progress Notes (Unsigned)
 Follow Up Note  RE: Jessica Cain MRN: [id number] DOB: 02/13/2001 Date of Office Visit: 10/01/2023  Referring provider: Arliss Journey, PA-C Primary care provider: Arliss Journey, PA-C  Chief Complaint: No chief complaint on file.  History of Present Illness: I had the pleasure of seeing Jessica Cain for a follow up visit at the [facility] on 09/30/2023. She is a 23 y.o. female, who is being followed for allergic rhinitis, asthma, food allergy, atopic dermatitis. Her previous allergy office visit was on 06/27/2023 with Dr. Selena Batten. Today is a regular follow up visit.  Discussed the use of AI scribe software for clinical note transcription with the patient, who gave verbal consent to proceed.  History of Present Illness            ***  Assessment and Plan: Jessica Cain is a 23 y.o. 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 History of allergy shots in middle school with some improvement. Today's skin testing positive to grass, weed, ragweed, trees, mold, dust mites, cat, dog, horse.  Start environmental control measures as below. Use over the counter antihistamines such as Zyrtec (cetirizine), Claritin (loratadine), Allegra (fexofenadine), or Xyzal (levocetirizine) daily as needed. May take twice a day during allergy flares. May switch antihistamines every few months. Use Flonase (fluticasone) nasal spray 1-2 sprays per nostril once a day as needed for nasal congestion.  Nasal saline spray (i.e., Simply Saline) or nasal saline lavage (i.e., NeilMed) is recommended as needed and prior to medicated nasal sprays. Consider allergy injections for long term control if above medications do not help the symptoms - handout given.   Moderate persistent asthma  without complication Past history - Daily shortness of breath for the past year. No recent use of albuterol inhaler. Regular exercise with noted difficulty catching breath post-cardio. 2024 spirometry showed: normal pattern with 5% improvement in FEV1 post  Daily controller medication(s): continue Breo 1 puff once a day and rinse mouth after each use. Prior to physical activity: May use albuterol rescue inhaler 2 puffs 5 to 15 minutes prior to strenuous physical activities. Rescue medications: May use albuterol rescue inhaler 2 puffs every 4 to 6 hours as needed for shortness of breath, chest tightness, coughing, and wheezing. Monitor frequency of use.    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 Today's skin testing positive to peanuts, walnuts, hazelnuts. If interested in certain tree nut reintroduction then will need bloodwork and in office food challenges (almonds, pistachios, cashews).  Continue to avoid peanuts and tree nuts - not interested in select tree nut reintroduction. For mild symptoms you can take over the counter antihistamines such as Benadryl 1-2 tablets = 25-50mg  and monitor symptoms closely. If symptoms worsen or if you have severe symptoms including breathing issues, throat closure, significant swelling, whole body hives, severe diarrhea and vomiting, lightheadedness then inject epinephrine and seek immediate medical care afterwards. Emergency action plan in place.   Lymphadenopathy, cervical Past history - Intermittent swelling of lymph nodes over the past few years, without associated illness or symptoms. Previous biopsy showed no abnormalities. Monitor symptoms.   Other atopic dermatitis Past history - Longstanding condition since childhood, currently managed with topical steroid creams. Recent trial of Dupixent without  noticeable improvement.  Continue proper skin  care. Don't wear any perfume. Use fragrance free and dye free products. No dryer sheets or fabric softener.   Assessment and Plan              No follow-ups on file.  No orders of the defined types were placed in this encounter.  Lab Orders  No laboratory test(s) ordered today    Diagnostics: Spirometry:  Tracings reviewed. Her effort: {Blank single:19197::"Good reproducible efforts.","It was hard to get consistent efforts and there is a question as to whether this reflects a maximal maneuver.","Poor effort, data can not be interpreted."} FVC: ***L FEV1: ***L, ***% predicted FEV1/FVC ratio: ***% Interpretation: {Blank single:19197::"Spirometry consistent with mild obstructive disease","Spirometry consistent with moderate obstructive disease","Spirometry consistent with severe obstructive disease","Spirometry consistent with possible restrictive disease","Spirometry consistent with mixed obstructive and restrictive disease","Spirometry uninterpretable due to technique","Spirometry consistent with normal pattern","No overt abnormalities noted given today's efforts"}.  Please see scanned spirometry results for details.  Skin Testing: {Blank single:19197::"Select foods","Environmental allergy panel","Environmental allergy panel and select foods","Food allergy panel","None","Deferred due to recent antihistamines use"}. *** Results discussed with patient/family.   Medication List:  Current Outpatient Medications  Medication Sig Dispense Refill  . Ascorbic Acid (VITAMIN C CR) 500 MG CPCR Take by mouth.    Marland Kitchen buPROPion (WELLBUTRIN XL) 150 MG 24 hr tablet Take 150 mg by mouth daily.    Marland Kitchen Fexofenadine HCl (ALLEGRA PO) Take by mouth.    . fluticasone (FLONASE) 50 MCG/ACT nasal spray Place 1-2 sprays into both nostrils daily as needed (nasal congestion). 16 g 5  . fluticasone furoate-vilanterol (BREO ELLIPTA) 100-25 MCG/ACT AEPB Inhale 1 puff into the lungs daily. Rinse mouth after each  use. 60 each 2  . hydrocortisone 2.5 % cream APPLY 1 APPLICATION TOPICALLY 2 (TWO) TIMES DAILY.    . hydrOXYzine (ATARAX) 25 MG tablet Take 25 mg by mouth every 8 (eight) hours as needed.    . triamcinolone cream (KENALOG) 0.1 % Apply 1 Application topically 2 (two) times daily.     No current facility-administered medications for this visit.   Allergies: Allergies  Allergen Reactions  . Augmentin [Amoxicillin-Pot Clavulanate]   . Cefzil [Cefprozil]   . Other     Tree nuts  . Peanut (Diagnostic)   . Sulfa Antibiotics    I reviewed her past medical history, social history, family history, and environmental history and no significant changes have been reported from her previous visit.  Review of Systems  Constitutional:  Negative for appetite change, chills, fever and unexpected weight change.  HENT:  Negative for congestion and rhinorrhea.   Eyes:  Negative for itching.  Respiratory:  Positive for shortness of breath. Negative for cough, chest tightness and wheezing.   Cardiovascular:  Negative for chest pain.  Gastrointestinal:  Negative for abdominal pain.  Genitourinary:  Negative for difficulty urinating.  Skin:  Positive for rash.  Allergic/Immunologic: Positive for environmental allergies and food allergies.  Neurological:  Negative for headaches.   Objective: There were no vitals taken for this visit. There is no height or weight on file to calculate BMI. Physical Exam Vitals and nursing note reviewed.  Constitutional:      Appearance: Normal appearance. She is well-developed.  HENT:     Head: Normocephalic and atraumatic.     Right Ear: Tympanic membrane and external ear normal.     Left Ear: Tympanic membrane and external ear normal.     Nose: Nose normal.     Mouth/Throat:  Mouth: Mucous membranes are moist.     Pharynx: Oropharynx is clear.  Eyes:     Conjunctiva/sclera: Conjunctivae normal.  Cardiovascular:     Rate and Rhythm: Normal rate and regular  rhythm.     Heart sounds: Normal heart sounds. No murmur Cain.    No friction rub. No gallop.  Pulmonary:     Effort: Pulmonary effort is normal.     Breath sounds: Normal breath sounds. No wheezing, rhonchi or rales.  Musculoskeletal:     Cervical back: Neck supple.  Skin:    General: Skin is warm.     Findings: No rash.     Comments: Cracked, dry skin on the corners of the lips.   Neurological:     Mental Status: She is alert and oriented to person, place, and time.  Psychiatric:        Behavior: Behavior normal.  Previous notes and tests were reviewed. The plan was reviewed with the patient/family, and all questions/concerned were addressed.  It was my pleasure to see [facility] today and participate in her care. Please feel free to contact me with any questions or concerns.  Sincerely,  Wyline Mood, DO Allergy & Immunology  [facility] office: [phone] [facility] office: [phone]

## 2023-10-01 ENCOUNTER — Encounter: Payer: Self-pay | Admitting: Allergy

## 2023-10-01 ENCOUNTER — Ambulatory Visit: Payer: 59 | Admitting: Allergy

## 2023-10-01 ENCOUNTER — Other Ambulatory Visit: Payer: Self-pay

## 2023-10-01 VITALS — BP 100/76 | HR 64 | Temp 98.3°F | Resp 14 | Wt 99.2 lb

## 2023-10-01 DIAGNOSIS — J4540 Moderate persistent asthma, uncomplicated: Secondary | ICD-10-CM

## 2023-10-01 DIAGNOSIS — L2089 Other atopic dermatitis: Secondary | ICD-10-CM

## 2023-10-01 DIAGNOSIS — J3089 Other allergic rhinitis: Secondary | ICD-10-CM | POA: Diagnosis not present

## 2023-10-01 DIAGNOSIS — J301 Allergic rhinitis due to pollen: Secondary | ICD-10-CM | POA: Diagnosis not present

## 2023-10-01 DIAGNOSIS — J3081 Allergic rhinitis due to animal (cat) (dog) hair and dander: Secondary | ICD-10-CM

## 2023-10-01 DIAGNOSIS — T7800XD Anaphylactic reaction due to unspecified food, subsequent encounter: Secondary | ICD-10-CM

## 2023-10-01 MED ORDER — FLUTICASONE PROPIONATE 50 MCG/ACT NA SUSP: 1.0000 | Freq: Every day | NASAL | 5 refills | Status: AC | PRN

## 2023-10-01 MED ORDER — FLUTICASONE FUROATE-VILANTEROL 100-25 MCG/ACT IN AEPB: 1.0000 | INHALATION_SPRAY | Freq: Every day | RESPIRATORY_TRACT | 5 refills | Status: AC

## 2023-10-01 MED ORDER — EUCRISA 2 % EX OINT: 1.0000 | TOPICAL_OINTMENT | Freq: Two times a day (BID) | CUTANEOUS | 5 refills | Status: AC | PRN

## 2023-10-01 NOTE — Patient Instructions (Addendum)
 Environmental allergies 2024 skin testing positive to grass, weed, ragweed, trees, mold, dust mites, cat, dog, horse.  Continue environmental control measures as below. Use over the counter antihistamines such as Zyrtec (cetirizine), Claritin (loratadine), Allegra (fexofenadine), or Xyzal (levocetirizine) daily as needed. May take twice a day during allergy flares. May switch antihistamines every few months. Use Flonase (fluticasone) nasal spray 1-2 sprays per nostril once a day as needed for nasal congestion.  Nasal saline spray (i.e., Simply Saline) or nasal saline lavage (i.e., NeilMed) is recommended as needed and prior to medicated nasal sprays. Consider allergy injections for long term control if above medications do not help the symptoms.   Lymph nodes Monitor symptoms.  Breathing Daily controller medication(s):Start Breo 1 puff once a day and rinse mouth after each use. Prior to physical activity: May use albuterol rescue inhaler 2 puffs 5 to 15 minutes prior to strenuous physical activities. Rescue medications: May use albuterol rescue inhaler 2 puffs every 4 to 6 hours as needed for shortness of breath, chest tightness, coughing, and wheezing. Monitor frequency of use.  Breathing control goals:  Full participation in all desired activities (may need albuterol before activity) Albuterol use two times or less a week on average (not counting use with activity) Cough interfering with sleep two times or less a month Oral steroids no more than once a year No hospitalizations   Food allergies 2024 skin testing positive to peanuts, walnuts, hazelnuts. Continue to avoid peanuts and tree nuts. For mild symptoms you can take over the counter antihistamines such as Benadryl 1-2 tablets = 25-50mg  and monitor symptoms closely. If symptoms worsen or if you have severe symptoms including breathing issues, throat closure, significant swelling, whole body hives, severe diarrhea and vomiting,  lightheadedness then inject epinephrine and seek immediate medical care afterwards. Emergency action plan in place.  Eczema  Continue proper skin care. For around the lips - Use Eucrisa (crisaborole) 2% ointment twice a day on mild rash flares on the face and body. This is a non-steroid ointment. Sample given.  If it burns, place the medication in the refrigerator.  Apply a thin layer of moisturizer and then apply the Eucrisa on top of it. Don't wear any perfume. Use fragrance free and dye free products. No dryer sheets or fabric softener.    Follow up in 3 months or sooner if needed.  Reducing Pollen Exposure Pollen seasons: trees (spring), grass (summer) and ragweed/weeds (fall). Keep windows closed in your home and car to lower pollen exposure.  Install air conditioning in the bedroom and throughout the house if possible.  Avoid going out in dry windy days - especially early morning. Pollen counts are highest between 5 - 10 AM and on dry, hot and windy days.  Save outside activities for late afternoon or after a heavy rain, when pollen levels are lower.  Avoid mowing of grass if you have grass pollen allergy. Be aware that pollen can also be transported indoors on people and pets.  Dry your clothes in an automatic dryer rather than hanging them outside where they might collect pollen.  Rinse hair and eyes before bedtime. Mold Control Mold and fungi can grow on a variety of surfaces provided certain temperature and moisture conditions exist.  Outdoor molds grow on plants, decaying vegetation and soil. The major outdoor mold, Alternaria and Cladosporium, are found in very high numbers during hot and dry conditions. Generally, a late summer - fall peak is seen for common outdoor fungal spores. Rain  will temporarily lower outdoor mold spore count, but counts rise rapidly when the rainy period ends. The most important indoor molds are Aspergillus and Penicillium. Dark, humid and poorly  ventilated basements are ideal sites for mold growth. The next most common sites of mold growth are the bathroom and the kitchen. Outdoor (Seasonal) Mold Control Use air conditioning and keep windows closed. Avoid exposure to decaying vegetation. Avoid leaf raking. Avoid grain handling. Consider wearing a face mask if working in moldy areas.  Indoor (Perennial) Mold Control  Maintain humidity below 50%. Get rid of mold growth on hard surfaces with water, detergent and, if necessary, 5% bleach (do not mix with other cleaners). Then dry the area completely. If mold covers an area more than 10 square feet, consider hiring an indoor environmental professional. For clothing, washing with soap and water is best. If moldy items cannot be cleaned and dried, throw them away. Remove sources e.g. contaminated carpets. Repair and seal leaking roofs or pipes. Using dehumidifiers in damp basements may be helpful, but empty the water and clean units regularly to prevent mildew from forming. All rooms, especially basements, bathrooms and kitchens, require ventilation and cleaning to deter mold and mildew growth. Avoid carpeting on concrete or damp floors, and storing items in damp areas. Control of House Dust Mite Allergen Dust mite allergens are a common trigger of allergy and asthma symptoms. While they can be found throughout the house, these microscopic creatures thrive in warm, humid environments such as bedding, upholstered furniture and carpeting. Because so much time is spent in the bedroom, it is essential to reduce mite levels there.  Encase pillows, mattresses, and box springs in special allergen-proof fabric covers or airtight, zippered plastic covers.  Bedding should be washed weekly in hot water (130 F) and dried in a hot dryer. Allergen-proof covers are available for comforters and pillows that can't be regularly washed.  Wash the allergy-proof covers every few months. Minimize clutter in the  bedroom. Keep pets out of the bedroom.  Keep humidity less than 50% by using a dehumidifier or air conditioning. You can buy a humidity measuring device called a hygrometer to monitor this.  If possible, replace carpets with hardwood, linoleum, or washable area rugs. If that's not possible, vacuum frequently with a vacuum that has a HEPA filter. Remove all upholstered furniture and non-washable window drapes from the bedroom. Remove all non-washable stuffed toys from the bedroom.  Wash stuffed toys weekly. Pet Allergen Avoidance: Contrary to popular opinion, there are no "hypoallergenic" breeds of dogs or cats. That is because people are not allergic to an animal's hair, but to an allergen found in the animal's saliva, dander (dead skin flakes) or urine. Pet allergy symptoms typically occur within minutes. For some people, symptoms can build up and become most severe 8 to 12 hours after contact with the animal. People with severe allergies can experience reactions in public places if dander has been transported on the pet owners' clothing. Keeping an animal outdoors is only a partial solution, since homes with pets in the yard still have higher concentrations of animal allergens. Before getting a pet, ask your allergist to determine if you are allergic to animals. If your pet is already considered part of your family, try to minimize contact and keep the pet out of the bedroom and other rooms where you spend a great deal of time. As with dust mites, vacuum carpets often or replace carpet with a hardwood floor, tile or linoleum. High-efficiency particulate  air (HEPA) cleaners can reduce allergen levels over time. While dander and saliva are the source of cat and dog allergens, urine is the source of allergens from rabbits, hamsters, mice and [place] pigs; so ask a non-allergic family member to clean the animal's cage. If you have a pet allergy, talk to your allergist about the potential for allergy  immunotherapy (allergy shots). This strategy can often provide long-term relief. Cockroach Allergen Avoidance Cockroaches are often found in the homes of densely populated urban areas, schools or commercial buildings, but these creatures can lurk almost anywhere. This does not mean that you have a dirty house or living area. Block all areas where roaches can enter the home. This includes crevices, wall cracks and windows.  Cockroaches need water to survive, so fix and seal all leaky faucets and pipes. Have an exterminator go through the house when your family and pets are gone to eliminate any remaining roaches. Keep food in lidded containers and put pet food dishes away after your pets are done eating. Vacuum and sweep the floor after meals, and take out garbage and recyclables. Use lidded garbage containers in the kitchen. Wash dishes immediately after use and clean under stoves, refrigerators or toasters where crumbs can accumulate. Wipe off the stove and other kitchen surfaces and cupboards regularly.  Skin care recommendations  Bath time: Always use lukewarm water. AVOID very hot or cold water. Keep bathing time to 5-10 minutes. Do NOT use bubble bath. Use a mild soap and use just enough to wash the dirty areas. Do NOT scrub skin vigorously.  After bathing, pat dry your skin with a towel. Do NOT rub or scrub the skin.  Moisturizers and prescriptions:  ALWAYS apply moisturizers immediately after bathing (within 3 minutes). This helps to lock-in moisture. Use the moisturizer several times a day over the whole body. Good summer moisturizers include: Aveeno, CeraVe, Cetaphil. Good winter moisturizers include: Aquaphor, Vaseline, Cerave, Cetap0hil, Eucerin, Vanicream. When using moisturizers along with medications, the moisturizer should be applied about one hour after applying the medication to prevent diluting effect of the medication or moisturize around where you applied the medications.  When not using medications, the moisturizer can be continued twice daily as maintenance.  Laundry and clothing: Avoid laundry products with added color or perfumes. Use unscented hypo-allergenic laundry products such as Tide free, Cheer free & gentle, and All free and clear.  If the skin still seems dry or sensitive, you can try double-rinsing the clothes. Avoid tight or scratchy clothing such as wool. Do not use fabric softeners or dyer sheets.

## 2023-12-30 NOTE — Progress Notes (Deleted)
 Follow Up Note  RE: Jessica Cain MRN: [id number] DOB: 08-Jun-2001 Date of Office Visit: 12/31/2023  Referring provider: Vladimir Groves, PA-C Primary care provider: Vladimir Groves, PA-C  Chief Complaint: No chief complaint on file.  History of Present Illness: I had the pleasure of seeing Jessica Cain for a follow up visit at the [facility] on 12/30/2023. She is a 23 y.o. female, who is being followed for allergic rhinitis, asthma, food allergy , atopic dermatitis. Her previous allergy  office visit was on 10/01/2023 with Dr. Burdette Carolin. Today is a regular follow up visit.  Discussed the use of AI scribe software for clinical note transcription with the patient, who gave verbal consent to proceed.  History of Present Illness            ***  Assessment and Plan: Jessica Cain is a 23 y.o. female with: Seasonal allergic rhinitis due to pollen Allergic rhinitis due to animal dander Allergic rhinitis due to dust mite Allergic rhinitis due to mold Past history - Year-round symptoms managed with daily Allegra, worse in the fall. History of allergy  shots in middle school with some improvement. 2024 skin testing positive to grass, weed, ragweed, trees, mold, dust mites, cat, dog, horse.  Interim history - Symptoms worsened after running out of Allegra. Continue environmental control measures as below. Use over the counter antihistamines such as Zyrtec (cetirizine), Claritin (loratadine), Allegra (fexofenadine), or Xyzal (levocetirizine) daily as needed. May take twice a day during allergy  flares. May switch antihistamines every few months. Use Flonase  (fluticasone ) nasal spray 1-2 sprays per nostril once a day as needed for nasal congestion.  Nasal saline spray (i.e., Simply Saline) or nasal saline lavage (i.e., NeilMed) is recommended as needed and prior to medicated nasal sprays. Consider allergy  injections for long term control if above medications do not help the symptoms.     Moderate persistent asthma without complication Past history - Daily shortness of breath for the past year. No recent use of albuterol inhaler. Regular exercise with noted difficulty catching breath post-cardio. 2024 spirometry showed: normal pattern with 5% improvement in FEV1 post. Interim history - Shortness of breath in the mornings, using Breo inhaler inconsistently (every other day). Today's spirometry was normal.  Daily controller medication(s):Start Breo 100mcg 1 puff once a day and rinse mouth after each use. Prior to physical activity: May use albuterol rescue inhaler 2 puffs 5 to 15 minutes prior to strenuous physical activities. Rescue medications: May use albuterol rescue inhaler 2 puffs every 4 to 6 hours as needed for shortness of breath, chest tightness, coughing, and wheezing. Monitor frequency of use.    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 2024 skin testing positive to peanuts, walnuts, hazelnuts. Continue to avoid peanuts and tree nuts. For mild symptoms you can take over the counter antihistamines such as Benadryl 1-2 tablets = 25-50mg  and monitor symptoms closely. If symptoms worsen or if you have severe symptoms including breathing issues, throat closure, significant swelling, whole body hives, severe diarrhea and vomiting, lightheadedness then inject epinephrine and seek immediate medical care afterwards. Emergency action plan in place.   Lymphadenopathy, cervical Past history - Intermittent swelling of lymph nodes over the past few years, without associated illness or symptoms. Previous biopsy showed no abnormalities. Interim history - waxes and wanes. No recent illness. Questions if it's flares with allergy /eczema flare.  Monitor symptoms. Consider allergy  shots if lymph node swelling correlates with allergy   flares.   Other atopic dermatitis Past history - Longstanding  condition since childhood, currently managed with topical steroid creams. Recent trial of Dupixent without noticeable improvement.  Interim history - some flaring around the lips.  Continue proper skin care. For around the lips - Use Eucrisa  (crisaborole ) 2% ointment twice a day on mild rash flares on the face and body. This is a non-steroid ointment. Sample given.  Don't wear any perfume. Use fragrance free and dye free products. No dryer sheets or fabric softener.   Assessment and Plan              No follow-ups on file.  No orders of the defined types were placed in this encounter.  Lab Orders  No laboratory test(s) ordered today    Diagnostics: Spirometry:  Tracings reviewed. Her effort: {Blank single:19197::"Good reproducible efforts.","It was hard to get consistent efforts and there is a question as to whether this reflects a maximal maneuver.","Poor effort, data can not be interpreted."} FVC: ***L FEV1: ***L, ***% predicted FEV1/FVC ratio: ***% Interpretation: {Blank single:19197::"Spirometry consistent with mild obstructive disease","Spirometry consistent with moderate obstructive disease","Spirometry consistent with severe obstructive disease","Spirometry consistent with possible restrictive disease","Spirometry consistent with mixed obstructive and restrictive disease","Spirometry uninterpretable due to technique","Spirometry consistent with normal pattern","No overt abnormalities noted given today's efforts"}.  Please see scanned spirometry results for details.  Skin Testing: {Blank single:19197::"Select foods","Environmental allergy  panel","Environmental allergy  panel and select foods","Food allergy  panel","None","Deferred due to recent antihistamines use"}. *** Results discussed with patient/family.   Medication List:  Current Outpatient Medications  Medication Sig Dispense Refill  . Ascorbic Acid (VITAMIN C CR) 500 MG CPCR Take by mouth.    Jessica Cain  (WELLBUTRIN XL) 150 MG 24 hr tablet Take 150 mg by mouth daily.    . Crisaborole  (EUCRISA ) 2 % OINT Apply 1 Application topically 2 (two) times daily as needed (mild rash). 60 g 5  . Fexofenadine HCl (ALLEGRA PO) Take by mouth.    . fluticasone  (FLONASE ) 50 MCG/ACT nasal spray Place 1-2 sprays into both nostrils daily as needed (nasal congestion). 16 g 5  . fluticasone  furoate-vilanterol (BREO ELLIPTA ) 100-25 MCG/ACT AEPB Inhale 1 puff into the lungs daily. Rinse mouth after each use. 60 each 5  . hydrocortisone 2.5 % cream APPLY 1 APPLICATION TOPICALLY 2 (TWO) TIMES DAILY.    . hydrOXYzine (ATARAX) 25 MG tablet Take 25 mg by mouth every 8 (eight) hours as needed.    . triamcinolone cream (KENALOG) 0.1 % Apply 1 Application topically 2 (two) times daily.     No current facility-administered medications for this visit.   Allergies: Allergies  Allergen Reactions  . Augmentin [Amoxicillin-Pot Clavulanate]   . Cefzil [Cefprozil]   . Other     Tree nuts  . Peanut (Diagnostic)   . Sulfa Antibiotics    I reviewed her past medical history, social history, family history, and environmental history and no significant changes have been reported from her previous visit.  Review of Systems  Constitutional:  Negative for appetite change, chills, fever and unexpected weight change.  HENT:  Negative for congestion and rhinorrhea.   Eyes:  Negative for itching.  Respiratory:  Positive for shortness of breath. Negative for cough, chest tightness and wheezing.   Cardiovascular:  Negative for chest pain.  Gastrointestinal:  Negative for abdominal pain.  Genitourinary:  Negative for difficulty urinating.  Skin:  Positive for rash.  Allergic/Immunologic: Positive for environmental allergies and food allergies.  Neurological:  Negative for headaches.  Hematological:  Positive for adenopathy.   Objective: There were no vitals taken for this visit. There is no height or weight on file to calculate  BMI. Physical Exam Vitals and nursing note reviewed.  Constitutional:      Appearance: Normal appearance. She is well-developed.  HENT:     Head: Normocephalic and atraumatic.     Right Ear: Tympanic membrane and external ear normal.     Left Ear: Tympanic membrane and external ear normal.     Nose: Nose normal.     Mouth/Throat:     Mouth: Mucous membranes are moist.     Pharynx: Oropharynx is clear.  Eyes:     Conjunctiva/sclera: Conjunctivae normal.  Cardiovascular:     Rate and Rhythm: Normal rate and regular rhythm.     Heart sounds: Normal heart sounds. No murmur heard.    No friction rub. No gallop.  Pulmonary:     Effort: Pulmonary effort is normal.     Breath sounds: Normal breath sounds. No wheezing, rhonchi or rales.  Musculoskeletal:     Cervical back: Neck supple.  Lymphadenopathy:     Cervical: Cervical adenopathy present.  Skin:    General: Skin is warm.     Findings: Rash present.     Comments: Perioral erythema.  Neurological:     Mental Status: She is alert and oriented to person, place, and time.  Psychiatric:        Behavior: Behavior normal.  Previous notes and tests were reviewed. The plan was reviewed with the patient/family, and all questions/concerned were addressed.  It was my pleasure to see Jessica Cain today and participate in her care. Please feel free to contact me with any questions or concerns.  Sincerely,  Eudelia Hero, DO Allergy  & Immunology  [facility]  [facility] office: [phone] [facility] office: [phone]

## 2023-12-31 ENCOUNTER — Ambulatory Visit: Payer: 59 | Admitting: Allergy

## 2023-12-31 DIAGNOSIS — J301 Allergic rhinitis due to pollen: Secondary | ICD-10-CM

## 2023-12-31 DIAGNOSIS — J4540 Moderate persistent asthma, uncomplicated: Secondary | ICD-10-CM

## 2023-12-31 DIAGNOSIS — J3081 Allergic rhinitis due to animal (cat) (dog) hair and dander: Secondary | ICD-10-CM

## 2023-12-31 DIAGNOSIS — J3089 Other allergic rhinitis: Secondary | ICD-10-CM

## 2023-12-31 DIAGNOSIS — J309 Allergic rhinitis, unspecified: Secondary | ICD-10-CM

## 2023-12-31 DIAGNOSIS — T7800XD Anaphylactic reaction due to unspecified food, subsequent encounter: Secondary | ICD-10-CM

## 2023-12-31 DIAGNOSIS — L2089 Other atopic dermatitis: Secondary | ICD-10-CM

## 2024-08-07 ENCOUNTER — Ambulatory Visit (HOSPITAL_COMMUNITY): Admission: EM | Admit: 2024-08-07 | Discharge: 2024-08-07 | Disposition: A | Discharge status: Home / Self Care

## 2024-08-07 DIAGNOSIS — F411 Generalized anxiety disorder: Secondary | ICD-10-CM

## 2024-08-07 NOTE — Discharge Instructions (Addendum)
 Discharge Recommendations:   Medications: Patient is to take Wellbutrin (bupropion) as prescribed by current provider, Rosaline Buttner, PA-C. The patient or patient's guardian is to contact a medical professional and/or outpatient provider to address any new side effects that develop. The patient or the patient's guardian should update outpatient providers of any new medications and/or medication changes.   You may take Buspar 10 mg (prescribed by Lyle Setters, NP) up to three times a day for anxiety   Outpatient Follow up and Therapy: Please see the list of referrals below. It is recommended the you begin individual counseling as well as discuss Dialectical Behavior Therapy (DBT) with your selected therapist. Please follow up with your primary care provider for all medical related needs.   Please contact one of the following facilities to start medication management and therapy services:   [facility] at [facility] [address]  [place], [place] [address] [phone]   [facility]  [address] [place], [place] [address] [phone]  [facility]  [address] [place], [place] [address] [phone]  [facility]  [address]  [place], [place] [address] [phone]  [facility]  [address] [place], [place] [address] [phone]  [facility]  [address]  [place], [place] [address] [phone]  [facility] Address: [address] [place], [place], [place] [address] Phone: [phone] Services: Specialize in outpatient and substance abuse therapy for couples, family and children  [facility] Address: [address], [place], [place] [address] Phone: [phone]  [facility] Address: [address], [place], [place] [address] Phone: [phone]  Safety:   The following safety precautions should be taken:   No sharp objects. This includes scissors, razors, scrapers, and putty knives.   Chemicals should be removed and locked up.   Medications should be removed and locked up.   Weapons should be removed and locked up. This includes firearms, knives and instruments that can be used to cause injury.   The patient should abstain from use of illicit substances/drugs and abuse of any medications.  If symptoms worsen or do not continue to improve or if the patient becomes actively suicidal or homicidal then it is recommended that the patient return to the closest hospital emergency department, the [facility], or call 911 for further evaluation and treatment.  National Suicide Prevention Lifeline 1-800-SUICIDE or 262-273-8085.  About 988 988 offers 24/7 access to trained crisis counselors who can help people experiencing mental health-related distress. People can call or text 988 or chat 988lifeline.org for themselves or if they are worried about a loved one who may need crisis support.
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
## 2024-08-07 NOTE — ED Notes (Signed)
 Patient discharged home per provider order. AVS given to patient and reviewed, no questions at this time. Patient belongings returned complete and intact. Patient escorted out to lobby via staff for transport to their destination. Safety maintained.
